# Patient Record
Sex: Male | Born: 1985 | Race: Black or African American | Hispanic: No | Marital: Single | Smoking: Current every day smoker
Health system: Southern US, Community
[De-identification: ages and names within clinical notes are randomized; demographics above are authoritative.]

## PROBLEM LIST (undated history)

## (undated) HISTORY — PX: FINGER SURGERY: SHX640

---
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
## 2005-07-05 ENCOUNTER — Emergency Department (HOSPITAL_COMMUNITY): Admission: EM | Admit: 2005-07-05 | Discharge: 2005-07-06 | Payer: Self-pay | Admitting: Emergency Medicine

## 2005-07-06 ENCOUNTER — Ambulatory Visit: Admission: RE | Admit: 2005-07-06 | Discharge: 2005-07-06 | Payer: Self-pay | Admitting: Family Medicine

## 2009-04-15 ENCOUNTER — Emergency Department (HOSPITAL_COMMUNITY): Admission: EM | Admit: 2009-04-15 | Discharge: 2009-04-15 | Payer: Self-pay | Admitting: Emergency Medicine

## 2009-04-26 ENCOUNTER — Encounter: Admission: RE | Admit: 2009-04-26 | Discharge: 2009-04-26 | Payer: Self-pay | Admitting: Chiropractic Medicine

## 2010-03-30 ENCOUNTER — Encounter: Payer: Self-pay | Admitting: Family Medicine

## 2010-10-29 ENCOUNTER — Emergency Department (HOSPITAL_COMMUNITY): Payer: BC Managed Care – PPO

## 2010-10-29 ENCOUNTER — Emergency Department (HOSPITAL_COMMUNITY)
Admission: EM | Admit: 2010-10-29 | Discharge: 2010-10-29 | Disposition: A | Discharge status: Home / Self Care | Payer: BC Managed Care – PPO | Attending: Emergency Medicine | Admitting: Emergency Medicine

## 2010-10-29 DIAGNOSIS — R0789 Other chest pain: Secondary | ICD-10-CM | POA: Insufficient documentation

## 2010-10-29 DIAGNOSIS — R002 Palpitations: Secondary | ICD-10-CM | POA: Insufficient documentation

## 2010-10-29 DIAGNOSIS — R110 Nausea: Secondary | ICD-10-CM | POA: Insufficient documentation

## 2010-10-29 DIAGNOSIS — F1010 Alcohol abuse, uncomplicated: Secondary | ICD-10-CM | POA: Insufficient documentation

## 2010-10-29 DIAGNOSIS — R0602 Shortness of breath: Secondary | ICD-10-CM | POA: Insufficient documentation

## 2010-10-29 DIAGNOSIS — R209 Unspecified disturbances of skin sensation: Secondary | ICD-10-CM | POA: Insufficient documentation

## 2010-10-29 DIAGNOSIS — E079 Disorder of thyroid, unspecified: Secondary | ICD-10-CM | POA: Insufficient documentation

## 2010-10-29 LAB — POCT I-STAT, CHEM 8
Chloride: 104 mEq/L (ref 96–112)
Creatinine, Ser: 1.4 mg/dL — ABNORMAL HIGH (ref 0.50–1.35)
Hemoglobin: 16 g/dL (ref 13.0–17.0)
Potassium: 3.4 mEq/L — ABNORMAL LOW (ref 3.5–5.1)
Sodium: 139 mEq/L (ref 135–145)

## 2010-10-29 LAB — D-DIMER, QUANTITATIVE: D-Dimer, Quant: 1.32 ug/mL-FEU — ABNORMAL HIGH (ref 0.00–0.48)

## 2010-10-29 MED ORDER — IOHEXOL 300 MG/ML  SOLN: 100.0000 mL | Freq: Once | INTRAMUSCULAR | Status: DC | PRN

## 2016-11-09 ENCOUNTER — Emergency Department (HOSPITAL_COMMUNITY): Payer: No Typology Code available for payment source

## 2016-11-09 ENCOUNTER — Emergency Department (HOSPITAL_COMMUNITY)
Admission: EM | Admit: 2016-11-09 | Discharge: 2016-11-10 | Disposition: A | Discharge status: Home / Self Care | Payer: No Typology Code available for payment source | Attending: Emergency Medicine | Admitting: Emergency Medicine

## 2016-11-09 ENCOUNTER — Encounter (HOSPITAL_COMMUNITY): Payer: Self-pay | Admitting: Emergency Medicine

## 2016-11-09 DIAGNOSIS — Y92410 Unspecified street and highway as the place of occurrence of the external cause: Secondary | ICD-10-CM | POA: Diagnosis not present

## 2016-11-09 DIAGNOSIS — Y9389 Activity, other specified: Secondary | ICD-10-CM | POA: Diagnosis not present

## 2016-11-09 DIAGNOSIS — F17200 Nicotine dependence, unspecified, uncomplicated: Secondary | ICD-10-CM | POA: Insufficient documentation

## 2016-11-09 DIAGNOSIS — S39012A Strain of muscle, fascia and tendon of lower back, initial encounter: Secondary | ICD-10-CM

## 2016-11-09 DIAGNOSIS — S299XXA Unspecified injury of thorax, initial encounter: Secondary | ICD-10-CM | POA: Diagnosis present

## 2016-11-09 DIAGNOSIS — Y999 Unspecified external cause status: Secondary | ICD-10-CM | POA: Insufficient documentation

## 2016-11-09 DIAGNOSIS — S46811A Strain of other muscles, fascia and tendons at shoulder and upper arm level, right arm, initial encounter: Secondary | ICD-10-CM | POA: Diagnosis not present
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
## 2016-11-10 MED ORDER — MELOXICAM 15 MG PO TABS: 15.0000 mg | ORAL_TABLET | Freq: Every day | ORAL | 0 refills | Status: AC

## 2016-11-10 MED ORDER — CYCLOBENZAPRINE HCL 10 MG PO TABS: 10.0000 mg | ORAL_TABLET | Freq: Two times a day (BID) | ORAL | 0 refills | Status: AC | PRN

## 2016-11-10 NOTE — Discharge Instructions (Signed)
Take Mobic for pain and inflammation as prescribed. Take Flexeril for muscle spasms. Avoid lifting anything heavier than 10 pounds. Rest. Try stretches and exercises as described below. If not improving in 1-2 weeks, follow-up with family doctor.
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
## 2018-02-04 ENCOUNTER — Emergency Department (HOSPITAL_COMMUNITY)
Admission: EM | Admit: 2018-02-04 | Discharge: 2018-02-04 | Disposition: A | Discharge status: Home / Self Care | Payer: Self-pay | Attending: Emergency Medicine | Admitting: Emergency Medicine

## 2018-02-04 ENCOUNTER — Other Ambulatory Visit: Payer: Self-pay

## 2018-02-04 ENCOUNTER — Encounter (HOSPITAL_COMMUNITY): Payer: Self-pay

## 2018-02-04 DIAGNOSIS — F17200 Nicotine dependence, unspecified, uncomplicated: Secondary | ICD-10-CM | POA: Insufficient documentation

## 2018-02-04 DIAGNOSIS — R05 Cough: Secondary | ICD-10-CM | POA: Insufficient documentation

## 2018-02-04 DIAGNOSIS — J029 Acute pharyngitis, unspecified: Secondary | ICD-10-CM | POA: Insufficient documentation

## 2018-02-04 LAB — GROUP A STREP BY PCR: Group A Strep by PCR: NOT DETECTED

## 2018-02-04 NOTE — Discharge Instructions (Signed)
Please read and follow all provided instructions.  Your diagnoses today include:  1. Acute pharyngitis, unspecified etiology     Tests performed today include:  Strep test: was negative for strep throat  Strep culture: you will be notified if this comes back positive  Vital signs. See below for your results today.   Medications prescribed:   None  Home care instructions:  Please read the educational materials provided and follow any instructions contained in this packet.  Follow-up instructions: Please follow-up with your primary care provider as needed for further evaluation of your symptoms.  Return instructions:   Please return to the Emergency Department if you experience worsening symptoms.   Return if you have worsening problems swallowing, your neck becomes swollen, you cannot swallow your saliva or your voice becomes muffled.   Return with high persistent fever, persistent vomiting, or if you have trouble breathing.   Please return if you have any other emergent concerns.  Additional Information:  Your vital signs today were: BP 133/78 (BP Location: Right Arm)    Pulse 83    Temp 99 F (37.2 C) (Oral)    Resp 17    Ht 6\' 2"  (1.88 m)    Wt 88.9 kg    SpO2 99%    BMI 25.16 kg/m  If your blood pressure (BP) was elevated above 135/85 this visit, please have this repeated by your doctor within one month. --------------

## 2018-02-04 NOTE — ED Triage Notes (Addendum)
Pt reports productive cough and sore throat for 2 days, denies fever.

## 2018-02-04 NOTE — ED Provider Notes (Signed)
[facility] Provider Note   CSN: [id number] Arrival date & time: 02/04/18  1745     History   Chief Complaint Chief Complaint  Patient presents with  . Sore Throat  . Cough    HPI Peter Mclaughlin is a 32 y.o. male.  Patient presents with complaint of sore throat over the past 2 days.  Patient has had some associated chills but no documented fevers.  No ear pain, runny nose.  He has occasional nonproductive cough.  No chest pain, nausea or vomiting.  No treatments prior to arrival.  Patient works in a [employer] and has been around other sick employees.  Onset of symptoms acute.  Course is constant.  Pain is worse with swallowing.     History reviewed. No pertinent past medical history.  There are no active problems to display for this patient.   Past Surgical History:  Procedure Laterality Date  . FINGER SURGERY          Home Medications    Prior to Admission medications   Medication Sig Start Date End Date Taking? Authorizing Provider  cyclobenzaprine (FLEXERIL) 10 MG tablet Take 1 tablet (10 mg total) by mouth 2 (two) times daily as needed for muscle spasms. 11/10/16   Kirichenko, Lemont Fillersatyana, PA-C  meloxicam (MOBIC) 15 MG tablet Take 1 tablet (15 mg total) by mouth daily. 11/10/16   Jaynie CrumbleKirichenko, Tatyana, PA-C    Family History Family History  Problem Relation Age of Onset  . Cancer Other     Social History Social History   Tobacco Use  . Smoking status: Current Every Day Smoker  . Smokeless tobacco: Never Used  Substance Use Topics  . Alcohol use: No  . Drug use: No     Allergies   Amoxicillin   Review of Systems Review of Systems  Constitutional: Positive for chills. Negative for fatigue and fever.  HENT: Positive for sore throat. Negative for congestion, ear pain, rhinorrhea and sinus pressure.   Eyes: Negative for redness.  Respiratory: Positive for cough. Negative for wheezing.   Gastrointestinal:  Negative for abdominal pain, diarrhea, nausea and vomiting.  Genitourinary: Negative for dysuria.  Musculoskeletal: Negative for myalgias and neck stiffness.  Skin: Negative for rash.  Neurological: Negative for headaches.  Hematological: Negative for adenopathy.     Physical Exam Updated Vital Signs BP 133/78 (BP Location: Right Arm)   Pulse 83   Temp 99 F (37.2 C) (Oral)   Resp 17   Ht 6\' 2"  (1.88 m)   Wt 88.9 kg   SpO2 99%   BMI 25.16 kg/m   Physical Exam  Constitutional: He appears well-developed and well-nourished.  HENT:  Head: Normocephalic and atraumatic.  Right Ear: Tympanic membrane, external ear and ear canal normal.  Left Ear: Tympanic membrane, external ear and ear canal normal.  Nose: Nose normal. No mucosal edema or rhinorrhea.  Mouth/Throat: Uvula is midline and mucous membranes are normal. Mucous membranes are not dry. No trismus in the jaw. No uvula swelling. Posterior oropharyngeal erythema present. No oropharyngeal exudate, posterior oropharyngeal edema or tonsillar abscesses.  Eyes: Conjunctivae are normal. Right eye exhibits no discharge. Left eye exhibits no discharge.  Neck: Normal range of motion. Neck supple.  Cardiovascular: Normal rate, regular rhythm and normal heart sounds.  Pulmonary/Chest: Effort normal and breath sounds normal. No respiratory distress. He has no wheezes. He has no rales.  Abdominal: Soft. There is no tenderness.  Neurological: He is alert.  Skin:  Skin is warm and dry.  Psychiatric: He has a normal mood and affect.  Nursing note and vitals reviewed.    ED Treatments / Results  Labs (all labs ordered are listed, but only abnormal results are displayed) Labs Reviewed  GROUP A STREP BY PCR    EKG None  Radiology No results found.  Procedures Procedures (including critical care time)  Medications Ordered in ED Medications - No data to display   Initial Impression / Assessment and Plan / ED Course  I have  reviewed the triage vital signs and the nursing notes.  Pertinent labs & imaging results that were available during my care of the patient were reviewed by me and considered in my medical decision making (see chart for details).     Patient seen and examined.  Strep performed by myself and is pending.  Vital signs reviewed and are as follows: BP 133/78 (BP Location: Right Arm)   Pulse 83   Temp 99 F (37.2 C) (Oral)   Resp 17   Ht 6\' 2"  (1.88 m)   Wt 88.9 kg   SpO2 99%   BMI 25.16 kg/m   Patient updated on negative strep test results.  Patient counseled on supportive care for viral pharyngitis and s/s to return including worsening symptoms, inability or trouble swallowing, persistent fever, persistent vomiting, or if they have any other concerns. Urged to see PCP if symptoms persist for more than 3 days. Patient verbalizes understanding and agrees with plan.    Final Clinical Impressions(s) / ED Diagnoses   Final diagnoses:  Acute pharyngitis, unspecified etiology   Patient with sore throat, no signs of peritonsillar abscess.  He is handling secretions without any respiratory distress.  Strep test is negative.  Vital signs are reassuring.  Comfortable with discharged home with supportive treatment at this time.  Return instructions discussed as above.  ED Discharge Orders    None       Renne Crigler, Cordelia Poche 02/04/18 1921    Mesner, Barbara Cower, MD 02/05/18 [phone]

## 2019-09-23 ENCOUNTER — Encounter (HOSPITAL_COMMUNITY): Payer: Self-pay

## 2019-09-23 ENCOUNTER — Other Ambulatory Visit: Payer: Self-pay

## 2019-09-23 ENCOUNTER — Emergency Department (HOSPITAL_COMMUNITY)
Admission: EM | Admit: 2019-09-23 | Discharge: 2019-09-23 | Disposition: A | Discharge status: Home / Self Care | Payer: Self-pay | Attending: Emergency Medicine | Admitting: Emergency Medicine

## 2019-09-23 DIAGNOSIS — Z79899 Other long term (current) drug therapy: Secondary | ICD-10-CM | POA: Insufficient documentation

## 2019-09-23 DIAGNOSIS — H6002 Abscess of left external ear: Secondary | ICD-10-CM

## 2019-09-23 DIAGNOSIS — F17210 Nicotine dependence, cigarettes, uncomplicated: Secondary | ICD-10-CM | POA: Insufficient documentation

## 2019-09-23 NOTE — ED Provider Notes (Signed)
[facility]-EMERGENCY DEPT Provider Note   CSN: [id number] Arrival date & time: 09/23/19  1533     History Chief Complaint  Patient presents with  . Ear Problem    Peter Mclaughlin is a 34 y.o. male.  Patient c/o abscess to left ear lobe. Started 3-4 days ago. Symptoms acute onset, mild-mod, constant, persistent. Pt unsure of cause. No known bite, sting, or trauma to area. States remove single prior abscess in another area of body. No hx mrsa. Denies any recurrent infection in this area. Located just behind lower aspect lobe of ear. No ear pain. No headache. No fever or chills. States otherwise does not feel sick or ill.   The history is provided by the patient.       History reviewed. No pertinent past medical history.  There are no problems to display for this patient.   Past Surgical History:  Procedure Laterality Date  . FINGER SURGERY         Family History  Problem Relation Age of Onset  . Cancer Other     Social History   Tobacco Use  . Smoking status: Current Every Day Smoker  . Smokeless tobacco: Never Used  Substance Use Topics  . Alcohol use: No  . Drug use: No    Home Medications Prior to Admission medications   Medication Sig Start Date End Date Taking? Authorizing Provider  cyclobenzaprine (FLEXERIL) 10 MG tablet Take 1 tablet (10 mg total) by mouth 2 (two) times daily as needed for muscle spasms. 11/10/16   Kirichenko, Lemont Fillers, PA-C  meloxicam (MOBIC) 15 MG tablet Take 1 tablet (15 mg total) by mouth daily. 11/10/16   Kirichenko, Lemont Fillers, PA-C    Allergies    Amoxicillin  Review of Systems   Review of Systems  Constitutional: Negative for fever.  HENT: Negative for ear discharge and sore throat.   Skin: Negative for rash.  Neurological: Negative for headaches.    Physical Exam Updated Vital Signs BP 128/84 (BP Location: Left Arm)   Pulse 73   Temp 98.9 F (37.2 C) (Oral)   Resp 18   Ht 1.88 m (6\' 2" )   Wt 76.7  kg   SpO2 99%   BMI 21.72 kg/m   Physical Exam Vitals and nursing note reviewed.  Constitutional:      Appearance: Normal appearance. He is well-developed.  HENT:     Head: Atraumatic.     Ears:     Comments: Left lobe of ear abscess, posterior, approx 1.5 cm diameter. Prior/remote piercings, but no earrings in place. Mastoid non tender. eac normal. No facial or ear cellulitis.     Nose: Nose normal.     Mouth/Throat:     Mouth: Mucous membranes are moist.     Pharynx: Oropharynx is clear.  Eyes:     General: No scleral icterus.    Conjunctiva/sclera: Conjunctivae normal.  Neck:     Trachea: No tracheal deviation.  Cardiovascular:     Rate and Rhythm: Normal rate.     Pulses: Normal pulses.  Pulmonary:     Effort: Pulmonary effort is normal. No accessory muscle usage or respiratory distress.  Abdominal:     General: There is no distension.  Genitourinary:    Comments: No cva tenderness. Musculoskeletal:        General: No swelling.     Cervical back: Normal range of motion and neck supple. No rigidity.  Skin:    General: Skin is warm and  dry.     Findings: No rash.  Neurological:     Mental Status: He is alert.     Comments: Alert, speech clear.   Psychiatric:        Mood and Affect: Mood normal.     ED Results / Procedures / Treatments   Labs (all labs ordered are listed, but only abnormal results are displayed) Labs Reviewed - No data to display  EKG None  Radiology No results found.  Procedures .Marland KitchenIncision and Drainage  Date/Time: 09/23/2019 5:49 PM Performed by: Cathren Laine, MD Authorized by: Cathren Laine, MD   Consent:    Consent obtained:  Verbal   Consent given by:  Parent Location:    Type:  Abscess   Location:  Head   Head/neck location: left ear lobe. Pre-procedure details:    Skin preparation:  Betadine Anesthesia (see MAR for exact dosages):    Anesthesia method:  Local infiltration   Local anesthetic:  Lidocaine 2% WITH  epi Procedure type:    Complexity:  Complex Procedure details:    Incision types:  Single straight   Incision depth:  Subcutaneous   Scalpel blade:  11   Wound management:  Probed and deloculated and irrigated with saline   Drainage:  Purulent   Drainage amount:  Moderate   Wound treatment:  Wound left open Post-procedure details:    Patient tolerance of procedure:  Tolerated well, no immediate complications   (including critical care time)  Medications Ordered in ED Medications - No data to display  ED Course  I have reviewed the triage vital signs and the nursing notes.  Pertinent labs & imaging results that were available during my care of the patient were reviewed by me and considered in my medical decision making (see chart for details).    MDM Rules/Calculators/A&P                          I and D left ear lobe abscess. Sterile dressing.   Pt tolerated procedure very well, and appears comfortable, no pain, and stable for d/c.   Reviewed nursing notes and prior charts for additional history.   Return precautions provided.      Final Clinical Impression(s) / ED Diagnoses Final diagnoses:  None    Rx / DC Orders ED Discharge Orders    None       Cathren Laine, MD 09/23/19 1750

## 2019-09-23 NOTE — ED Triage Notes (Signed)
He has a marble-size abscess of left ear lobe, which he tells me he's had for about 4 days. He denies fevr, nor any other sign of current illness.

## 2019-09-23 NOTE — Discharge Instructions (Signed)
It was our pleasure to provide your ER care today - we hope that you feel better.  Keep area very clean. Wash with soap and water 2x/day. Warm compresses to sore area.   Return to ER if worse, new symptoms, severe pain, spreading redness, increased swelling, fevers, or other concern.

## 2020-01-30 ENCOUNTER — Ambulatory Visit (HOSPITAL_COMMUNITY)
Admission: EM | Admit: 2020-01-30 | Discharge: 2020-01-30 | Disposition: A | Discharge status: Home / Self Care | Payer: Self-pay | Attending: Urgent Care | Admitting: Urgent Care

## 2020-01-30 ENCOUNTER — Encounter (HOSPITAL_COMMUNITY): Payer: Self-pay | Admitting: Emergency Medicine

## 2020-01-30 ENCOUNTER — Other Ambulatory Visit: Payer: Self-pay

## 2020-01-30 DIAGNOSIS — L729 Follicular cyst of the skin and subcutaneous tissue, unspecified: Secondary | ICD-10-CM

## 2020-01-30 DIAGNOSIS — L089 Local infection of the skin and subcutaneous tissue, unspecified: Secondary | ICD-10-CM

## 2020-01-30 MED ORDER — LIDOCAINE-EPINEPHRINE 1 %-1:100000 IJ SOLN
INTRAMUSCULAR | Status: AC
  Filled 2020-01-30: qty 1

## 2020-01-30 MED ORDER — FLUORESCEIN SODIUM 1 MG OP STRP
ORAL_STRIP | OPHTHALMIC | Status: AC
  Filled 2020-01-30: qty 1

## 2020-01-30 MED ORDER — TETRACAINE HCL 0.5 % OP SOLN
OPHTHALMIC | Status: AC
  Filled 2020-01-30: qty 4

## 2020-01-30 MED ORDER — DOXYCYCLINE HYCLATE 100 MG PO CAPS: 100.0000 mg | ORAL_CAPSULE | Freq: Two times a day (BID) | ORAL | 0 refills | Status: DC

## 2020-01-30 NOTE — ED Provider Notes (Signed)
[facility]   MRN: [id number] DOB: 12/17/1985  Subjective:   Peter Mclaughlin is a 34 y.o. male presenting for 1 month history of persistent and worsening swelling with slight tenderness over the backside of his ER now penetrating to the front side of his ear.  Has a history of cyst and abscess of the left side.  Does not have a PCP or dermatologist.  Denies fever, nausea, vomiting, facial or dental pain or neck pain.  No current facility-administered medications for this encounter.  Current Outpatient Medications:  .  cyclobenzaprine (FLEXERIL) 10 MG tablet, Take 1 tablet (10 mg total) by mouth 2 (two) times daily as needed for muscle spasms., Disp: 20 tablet, Rfl: 0 .  meloxicam (MOBIC) 15 MG tablet, Take 1 tablet (15 mg total) by mouth daily., Disp: 20 tablet, Rfl: 0   Allergies  Allergen Reactions  . Amoxicillin     History reviewed. No pertinent past medical history.   Past Surgical History:  Procedure Laterality Date  . FINGER SURGERY      Family History  Problem Relation Age of Onset  . Cancer Other     Social History   Tobacco Use  . Smoking status: Current Every Day Smoker  . Smokeless tobacco: Never Used  Substance Use Topics  . Alcohol use: No  . Drug use: No    ROS   Objective:   Vitals: BP 99/61 (BP Location: Right Arm)   Pulse 78   Temp 97.9 F (36.6 C) (Oral)   Resp 17   SpO2 97%   Physical Exam Constitutional:      General: He is not in acute distress.    Appearance: Normal appearance. He is well-developed and normal weight. He is not ill-appearing, toxic-appearing or diaphoretic.  HENT:     Head: Normocephalic and atraumatic.     Right Ear: External ear normal.     Left Ear: External ear normal.     Ears:     Comments: 2 distinct fluctuant areas just posterior to the right inferior ear with slight swelling to the inferior anterior lower ear.  There is associated erythema and slight tenderness.    Nose: Nose normal.       Mouth/Throat:     Pharynx: Oropharynx is clear.  Eyes:     General: No scleral icterus.       Right eye: No discharge.        Left eye: No discharge.     Extraocular Movements: Extraocular movements intact.     Pupils: Pupils are equal, round, and reactive to light.  Cardiovascular:     Rate and Rhythm: Normal rate.  Pulmonary:     Effort: Pulmonary effort is normal.  Musculoskeletal:     Cervical back: Normal range of motion.  Neurological:     Mental Status: He is alert and oriented to person, place, and time.  Psychiatric:        Mood and Affect: Mood normal.        Behavior: Behavior normal.        Thought Content: Thought content normal.        Judgment: Judgment normal.     PROCEDURE NOTE: I&D of Abscess Verbal consent obtained. Local anesthesia with 2cc of 1% lidocaine with epinephrine. Site cleansed with Betadine.  2 separate incisions of 1/2cm were made using a 11 blade, discharge of mixture of sebaceous cyst material, pus and serosanguinous fluid. Wound cavity was explored with curved hemostats  and 2 cyst dissected using Adson forceps with curved hemostat.  Cleansed and dressed.   Assessment and Plan :   PDMP not reviewed this encounter.  1. Infected cyst of skin     Cyst successfully removed using incision and drainage.  Recommended doxycycline, reviewed wound care.  Use Tylenol and/or ibuprofen for pain control.  Recommended establishing care with a dermatologist for follow-up and further management of recurring epidermoid cysts. Counseled patient on potential for adverse effects with medications prescribed/recommended today, ER and return-to-clinic precautions discussed, patient verbalized understanding.    Wallis Bamberg, PA-C 01/30/20 1500

## 2020-01-30 NOTE — Discharge Instructions (Signed)
Please change your dressing 2-3 times daily. Do not apply any ointments or creams. Each time you change your dressing, make sure that you are pressing on the wound to get pus to come out.  Try your best to have a family member help you clean gently around the perimeter of the wound with gentle soap and warm water. Pat your wound dry and let it air out if possible to make sure it is dry before reapplying another dressing. Contact [facility] or [facility] for follow up on recurrent epidermal cysts.

## 2020-01-30 NOTE — ED Triage Notes (Signed)
Pt presents with abscess located behind right ear. States has continued to grow over the past month.

## 2021-12-08 ENCOUNTER — Encounter (HOSPITAL_BASED_OUTPATIENT_CLINIC_OR_DEPARTMENT_OTHER): Payer: Self-pay | Admitting: Emergency Medicine

## 2021-12-08 ENCOUNTER — Other Ambulatory Visit: Payer: Self-pay

## 2021-12-08 ENCOUNTER — Emergency Department (HOSPITAL_BASED_OUTPATIENT_CLINIC_OR_DEPARTMENT_OTHER)
Admission: EM | Admit: 2021-12-08 | Discharge: 2021-12-08 | Disposition: A | Discharge status: Home / Self Care | Payer: Medicaid Other | Attending: Emergency Medicine | Admitting: Emergency Medicine

## 2021-12-08 DIAGNOSIS — H1032 Unspecified acute conjunctivitis, left eye: Secondary | ICD-10-CM | POA: Insufficient documentation

## 2021-12-08 MED ORDER — ERYTHROMYCIN 5 MG/GM OP OINT
TOPICAL_OINTMENT | Freq: Four times a day (QID) | OPHTHALMIC | Status: DC
  Filled 2021-12-08: qty 3.5

## 2021-12-08 NOTE — ED Triage Notes (Signed)
Pt arrives to ED with c/o left eye discharge and itching x1 days.

## 2021-12-08 NOTE — ED Notes (Signed)
Patient verbalizes understanding of discharge instructions. Opportunity for questioning and answers were provided. Patient discharged from ED.  °

## 2021-12-08 NOTE — ED Provider Notes (Signed)
[facility] EMERGENCY DEPT Provider Note   CSN: [id number] Arrival date & time: 12/08/21  0736     History  Chief Complaint  Patient presents with   Eye Drainage    Peter Mclaughlin is a 36 y.o. male.  Patient here with left eye irritation, discharge 1 day.  Daughter with the same.  Nothing makes it worse or better.  Denies any trauma or pain.  No vision loss.  Nothing makes it worse or better.  Denies any fevers or chills or headache.  The history is provided by the patient.       Home Medications Prior to Admission medications   Medication Sig Start Date End Date Taking? Authorizing Provider  cyclobenzaprine (FLEXERIL) 10 MG tablet Take 1 tablet (10 mg total) by mouth 2 (two) times daily as needed for muscle spasms. 11/10/16   Kirichenko, Lemont Fillers, PA-C  doxycycline (VIBRAMYCIN) 100 MG capsule Take 1 capsule (100 mg total) by mouth 2 (two) times daily. 01/30/20   Wallis Bamberg, PA-C  meloxicam (MOBIC) 15 MG tablet Take 1 tablet (15 mg total) by mouth daily. 11/10/16   Kirichenko, Lemont Fillers, PA-C      Allergies    Amoxicillin    Review of Systems   Review of Systems  Physical Exam Updated Vital Signs BP 118/82 (BP Location: Right Arm)   Pulse 83   Temp 98.1 F (36.7 C)   Resp 16   Ht 6\' 2"  (1.88 m)   Wt 97.1 kg   SpO2 98%   BMI 27.48 kg/m  Physical Exam HENT:     Head: Normocephalic and atraumatic.     Nose: Nose normal.     Mouth/Throat:     Mouth: Mucous membranes are moist.  Eyes:     General:        Right eye: No discharge.        Left eye: No discharge.     Extraocular Movements: Extraocular movements intact.     Pupils: Pupils are equal, round, and reactive to light.     Comments: Conjunctiva is irritated on the left, normal visual fields and visual acuity bilaterally, there is no pain with extraocular motion, there is no swelling around the eye, pupils equal and reactive  Neurological:     Mental Status: He is alert.     ED Results /  Procedures / Treatments   Labs (all labs ordered are listed, but only abnormal results are displayed) Labs Reviewed - No data to display  EKG None  Radiology No results found.  Procedures Procedures    Medications Ordered in ED Medications  erythromycin ophthalmic ointment (has no administration in time range)    ED Course/ Medical Decision Making/ A&P                           Medical Decision Making Risk Prescription drug management.   Peter Mclaughlin is here with left eye irritation.  Appears to have conjunctivitis.  Normal vitals.  No fever.  He has no pain.  Have no concern for iritis or acute angle glaucoma or other acute eye emergency at this time.  Daughter with the same symptoms.  He has had some mild discharge, crusting over of the eye.  We will treat with erythromycin ointment.  We will have him follow-up with ophthalmology if not better.  Told to return if he develops any severe pain or worsening symptoms.  This chart was dictated using voice  recognition software.  Despite best efforts to proofread,  errors can occur which can change the documentation meaning.         Final Clinical Impression(s) / ED Diagnoses Final diagnoses:  Acute conjunctivitis of left eye, unspecified acute conjunctivitis type    Rx / DC Orders ED Discharge Orders     None         Lennice Sites, DO 12/08/21 [phone]

## 2021-12-08 NOTE — Discharge Instructions (Addendum)
Use eye ointment every 6 hours for the next 3 to 5 days/until cleared.  Follow-up with eye doctor if not getting better after 5 days.

## 2021-12-10 ENCOUNTER — Encounter (HOSPITAL_BASED_OUTPATIENT_CLINIC_OR_DEPARTMENT_OTHER): Payer: Self-pay

## 2021-12-10 ENCOUNTER — Other Ambulatory Visit: Payer: Self-pay

## 2021-12-10 ENCOUNTER — Emergency Department (HOSPITAL_BASED_OUTPATIENT_CLINIC_OR_DEPARTMENT_OTHER)
Admission: EM | Admit: 2021-12-10 | Discharge: 2021-12-10 | Disposition: A | Discharge status: Home / Self Care | Payer: Medicaid Other | Attending: Emergency Medicine | Admitting: Emergency Medicine

## 2021-12-10 DIAGNOSIS — H109 Unspecified conjunctivitis: Secondary | ICD-10-CM | POA: Insufficient documentation

## 2021-12-10 DIAGNOSIS — H1032 Unspecified acute conjunctivitis, left eye: Secondary | ICD-10-CM

## 2021-12-10 DIAGNOSIS — Z0279 Encounter for issue of other medical certificate: Secondary | ICD-10-CM | POA: Insufficient documentation

## 2021-12-10 NOTE — ED Triage Notes (Signed)
Pt states that he was dx with Monday with pink eye. 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

## 2021-12-10 NOTE — ED Provider Notes (Signed)
  [facility] EMERGENCY DEPT Provider Note   CSN: [id number] Arrival date & time: 12/10/21  1713     History  Chief Complaint  Patient presents with   Letter for School/Work   Conjunctivitis    Peter Mclaughlin is a 36 y.o. male.   Conjunctivitis     Patient presents due to requesting work note for left eye conjunctivitis.  Seen yesterday in the ED, given erythromycin ointment.  He is been having some improvement of his symptoms.  He is still having some discharge from the eye as well as crusting.  Denies any vision changes, double vision, facial swelling.  Home Medications Prior to Admission medications   Medication Sig Start Date End Date Taking? Authorizing Provider  cyclobenzaprine (FLEXERIL) 10 MG tablet Take 1 tablet (10 mg total) by mouth 2 (two) times daily as needed for muscle spasms. 11/10/16   Kirichenko, Lahoma Rocker, PA-C  doxycycline (VIBRAMYCIN) 100 MG capsule Take 1 capsule (100 mg total) by mouth 2 (two) times daily. 01/30/20   Jaynee Eagles, PA-C  meloxicam (MOBIC) 15 MG tablet Take 1 tablet (15 mg total) by mouth daily. 11/10/16   Kirichenko, Lahoma Rocker, PA-C      Allergies    Amoxicillin    Review of Systems   Review of Systems  Physical Exam Updated Vital Signs BP (!) 168/96 (BP Location: Right Arm)   Pulse 79   Temp (!) 97.5 F (36.4 C) (Oral)   Resp 18   SpO2 100%  Physical Exam Vitals and nursing note reviewed. Exam conducted with a chaperone present.  Constitutional:      General: He is not in acute distress.    Appearance: Normal appearance.  HENT:     Head: Normocephalic and atraumatic.  Eyes:     General: No scleral icterus.    Extraocular Movements: Extraocular movements intact.     Pupils: Pupils are equal, round, and reactive to light.     Comments: Left eye is injected, clear drainage.  No appreciable discharge with some crusting on the eyelids.  EOMI.  No nystagmus.  Skin:    Coloration: Skin is not jaundiced.  Neurological:      Mental Status: He is alert. Mental status is at baseline.     Coordination: Coordination normal.     ED Results / Procedures / Treatments   Labs (all labs ordered are listed, but only abnormal results are displayed) Labs Reviewed - No data to display  EKG None  Radiology No results found.  Procedures Procedures    Medications Ordered in ED Medications - No data to display  ED Course/ Medical Decision Making/ A&P                           Medical Decision Making  Patient presents requesting work note.  He is left eye conjunctivitis, has been using Romycin movement with some improvement of symptoms.  Denies fever fever, vision changes, double vision, diplopia, facial swelling.  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  Work note was provided.  Patient discharge stable condition return precautions were discussed.        Final Clinical Impression(s) / ED Diagnoses Final diagnoses:  None    Rx / DC Orders ED Discharge Orders     None         Sherrill Raring, Hershal Coria 12/10/21 1944    Ezequiel Essex, MD 12/11/21 0045

## 2021-12-10 NOTE — Discharge Instructions (Signed)
After being on antibiotics 24 hours he can return to work once he stopped having new discharge from the eye.  Was discharged and return to work, work note provided below.

## 2023-05-04 ENCOUNTER — Encounter (HOSPITAL_BASED_OUTPATIENT_CLINIC_OR_DEPARTMENT_OTHER): Payer: Self-pay

## 2023-05-04 ENCOUNTER — Emergency Department (HOSPITAL_BASED_OUTPATIENT_CLINIC_OR_DEPARTMENT_OTHER)
Admission: EM | Admit: 2023-05-04 | Discharge: 2023-05-04 | Disposition: A | Discharge status: Home / Self Care | Payer: 59 | Attending: Emergency Medicine | Admitting: Emergency Medicine

## 2023-05-04 ENCOUNTER — Other Ambulatory Visit: Payer: Self-pay

## 2023-05-04 DIAGNOSIS — H6002 Abscess of left external ear: Secondary | ICD-10-CM | POA: Insufficient documentation

## 2023-05-04 DIAGNOSIS — F17200 Nicotine dependence, unspecified, uncomplicated: Secondary | ICD-10-CM | POA: Insufficient documentation

## 2023-05-04 MED ORDER — LIDOCAINE HCL (PF) 1 % IJ SOLN
10.0000 mL | Freq: Once | INTRAMUSCULAR | Status: DC
  Filled 2023-05-04: qty 10

## 2023-05-04 MED ORDER — SULFAMETHOXAZOLE-TRIMETHOPRIM 800-160 MG PO TABS
1.0000 | ORAL_TABLET | Freq: Two times a day (BID) | ORAL | 0 refills | Status: AC
Start: 2023-05-04 — End: 2023-05-09

## 2023-05-04 NOTE — ED Triage Notes (Signed)
 Pt presents via POV c/o abscess behind left ear x3 days.

## 2023-05-04 NOTE — Discharge Instructions (Signed)
 You were evaluated in the Emergency Department and after careful evaluation, we did not find any emergent condition requiring admission or further testing in the hospital.  Your exam/testing today was overall reassuring.  Symptoms seem to be due to an abscess.  We drained your abscess here in the emergency department.  The area may drain some bloody fluid over the next day or two.  Use the Bactrim antibiotic as directed to help clear any remaining infection.  Please return to the Emergency Department if you experience any worsening of your condition.  Thank you for allowing [place] to be a part of your care.

## 2023-05-04 NOTE — ED Provider Notes (Signed)
 [facility] Emergency Department Provider Note MRN:  [id number]  Arrival date & time: 05/04/23     Chief Complaint   Abscess   History of Present Illness   Peter Mclaughlin is a 38 y.o. year-old male with no pertinent past medical history presenting to the ED with chief complaint of abscess.  Painful swollen area behind the left ear, wondering if it is an abscess.  More tender over the past 2 days.  Denies fever.  Review of Systems  A thorough review of systems was obtained and all systems are negative except as noted in the HPI and PMH.   Patient's Health History   History reviewed. No pertinent past medical history.  Past Surgical History:  Procedure Laterality Date   FINGER SURGERY      Family History  Problem Relation Age of Onset   Cancer Other     Social History   Socioeconomic History   Marital status: Single    Spouse name: Not on file   Number of children: Not on file   Years of education: Not on file   Highest education level: Not on file  Occupational History   Not on file  Tobacco Use   Smoking status: Every Day   Smokeless tobacco: Never  Substance and Sexual Activity   Alcohol use: No   Drug use: No   Sexual activity: Not on file  Other Topics Concern   Not on file  Social History Narrative   Not on file   Social Drivers of Health   Financial Resource Strain: Not on file  Food Insecurity: Not on file  Transportation Needs: Not on file  Physical Activity: Not on file  Stress: Not on file  Social Connections: Unknown (07/11/2021)   Received from [facility]   Social Network    Social Network: Not on file  Intimate Partner Violence: Unknown (06/11/2021)   Received from [employer], [facility]   HITS    Physically Hurt: Not on file    Insult or Talk Down To: Not on file    Threaten Physical Harm: Not on file    Scream or Curse: Not on file     Physical Exam   Vitals:   05/04/23 0035  BP: (!)  140/94  Pulse: 65  Resp: 16  Temp: 97.9 F (36.6 C)  SpO2: 99%    CONSTITUTIONAL: Well-appearing, NAD NEURO/PSYCH:  Alert and oriented x 3, no focal deficits EYES:  eyes equal and reactive ENT/NECK:  no LAD, no JVD CARDIO: Regular rate, well-perfused, normal S1 and S2 PULM:  CTAB no wheezing or rhonchi GI/GU:  non-distended, non-tender MSK/SPINE:  No gross deformities, no edema SKIN:  no rash, atraumatic   *Additional and/or pertinent findings included in MDM below  Diagnostic and Interventional Summary    EKG Interpretation Date/Time:    Ventricular Rate:    PR Interval:    QRS Duration:    QT Interval:    QTC Calculation:   R Axis:      Text Interpretation:         Labs Reviewed - No data to display  No orders to display    Medications  lidocaine (PF) (XYLOCAINE) 1 % injection 10 mL (10 mLs Infiltration Handoff 05/04/23 0404)     Procedures  /  Critical Care .Incision and Drainage  Date/Time: 05/04/2023 4:54 AM  Performed by: Sabas Sous, MD Authorized by: Sabas Sous, MD   Consent:  Consent obtained:  Verbal   Consent given by:  Patient   Risks, benefits, and alternatives were discussed: yes     Risks discussed:  Bleeding, damage to other organs, incomplete drainage, infection and pain   Alternatives discussed: Antibiotics alone. Universal protocol:    Procedure explained and questions answered to patient or proxy's satisfaction: yes     Immediately prior to procedure, a time out was called: yes     Patient identity confirmed:  Verbally with patient Location:    Type:  Abscess   Size:  2 cm   Location:  Head   Head/neck location: Left postauricular area. Pre-procedure details:    Skin preparation:  Povidone-iodine Sedation:    Sedation type:  None Anesthesia:    Anesthesia method:  Local infiltration   Local anesthetic:  Lidocaine 1% w/o epi Procedure type:    Complexity:  Complex Procedure details:    Incision types:  Cruciate    Incision depth:  Subcutaneous   Wound management:  Probed and deloculated, irrigated with saline, extensive cleaning and debrided   Drainage:  Purulent   Drainage amount:  Moderate   Wound treatment:  Wound left open   Packing materials:  None Post-procedure details:    Procedure completion:  Tolerated well, no immediate complications   ED Course and Medical Decision Making  Initial Impression and Ddx Fluctuant area behind the left ear adjacent to piercing site, no recent piercings.  Suspect need for incision and drainage.  Past medical/surgical history that increases complexity of ED encounter: None  Interpretation of Diagnostics Laboratory and/or imaging options to aid in the diagnosis/care of the patient were considered.  After careful history and physical examination, it was determined that there was no indication for diagnostics at this time. Patient Reassessment and Ultimate Disposition/Management     Incision and drainage as described above, appropriate for discharge.  Patient management required discussion with the following services or consulting groups:  None  Complexity of Problems Addressed Acute complicated illness or Injury  Additional Data Reviewed and Analyzed Further history obtained from: None  Additional Factors Impacting ED Encounter Risk Prescriptions and Minor Procedures  Elmer Sow. Pilar Plate, MD [facility] Emergency Medicine [facility] [email or web address]  Final Clinical Impressions(s) / ED Diagnoses     ICD-10-CM   1. Abscess of left external ear  H60.02       ED Discharge Orders          Ordered    sulfamethoxazole-trimethoprim (BACTRIM DS) 800-160 MG tablet  2 times daily        05/04/23 0454             Discharge Instructions Discussed with and Provided to Patient:     Discharge Instructions      You were evaluated in the Emergency Department and after careful evaluation, we did not find any emergent condition  requiring admission or further testing in the hospital.  Your exam/testing today was overall reassuring.  Symptoms seem to be due to an abscess.  We drained your abscess here in the emergency department.  The area may drain some bloody fluid over the next day or two.  Use the Bactrim antibiotic as directed to help clear any remaining infection.  Please return to the Emergency Department if you experience any worsening of your condition.  Thank you for allowing [place] to be a part of your care.        Sabas Sous, MD 05/04/23 [phone]

## 2024-02-21 ENCOUNTER — Other Ambulatory Visit: Payer: Self-pay

## 2024-02-21 ENCOUNTER — Emergency Department (HOSPITAL_BASED_OUTPATIENT_CLINIC_OR_DEPARTMENT_OTHER)
Admission: EM | Admit: 2024-02-21 | Discharge: 2024-02-21 | Discharge status: Against Medical Advice | Payer: Self-pay | Source: Home / Self Care
